# Patient Record
Sex: Male | Born: 1945 | Race: Black or African American | Hispanic: No | Marital: Married | Smoking: Never smoker
Health system: Southern US, Community
[De-identification: ages and names within clinical notes are randomized; demographics above are authoritative.]

## PROBLEM LIST (undated history)

## (undated) DIAGNOSIS — I10 Essential (primary) hypertension: Secondary | ICD-10-CM

## (undated) DIAGNOSIS — R569 Unspecified convulsions: Secondary | ICD-10-CM

---

## 2006-10-10 ENCOUNTER — Emergency Department (HOSPITAL_COMMUNITY): Admission: EM | Admit: 2006-10-10 | Discharge: 2006-10-10 | Payer: Self-pay | Admitting: Emergency Medicine

## 2006-10-20 ENCOUNTER — Emergency Department (HOSPITAL_COMMUNITY): Admission: EM | Admit: 2006-10-20 | Discharge: 2006-10-20 | Payer: Self-pay | Admitting: Emergency Medicine

## 2007-03-13 ENCOUNTER — Emergency Department (HOSPITAL_COMMUNITY): Admission: EM | Admit: 2007-03-13 | Discharge: 2007-03-13 | Payer: Self-pay | Admitting: Emergency Medicine

## 2007-05-18 ENCOUNTER — Emergency Department (HOSPITAL_COMMUNITY): Admission: EM | Admit: 2007-05-18 | Discharge: 2007-05-19 | Payer: Self-pay | Admitting: Emergency Medicine

## 2009-04-24 IMAGING — CT CT L SPINE W/O CM
1 of 3 series · 12 of 33 positions shown, 15 images · IV contrast (agent unspecified)
Comparison: None.

CLINICAL DATA: 61 year-old-male with low back and right leg pain. Had epidural steroid injection [REDACTED] of this week. 
 LUMBAR SPINE CT WITHOUT CONTRAST:
TECHNIQUE: Multidetector CT imaging of the lumbar spine was performed.  Multiplanar CT image reconstructions were also generated.

[Series 3: l-spine 3.0 b30s · axial · 0.29mm/px · z∈[-482,-304]mm · 12 of 71 slices shown, 15 images]
[im 6/71  soft-tissue]
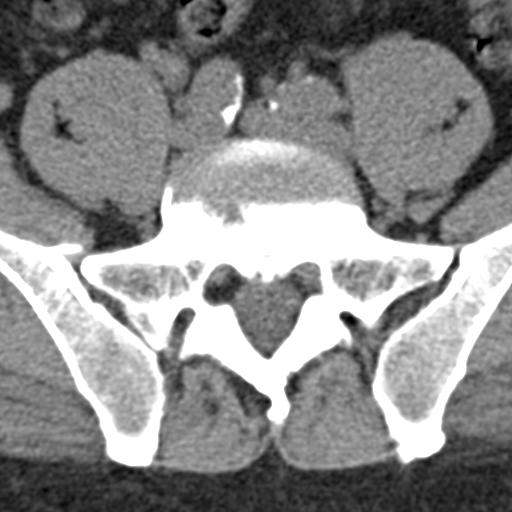
[im 6/71  bone]
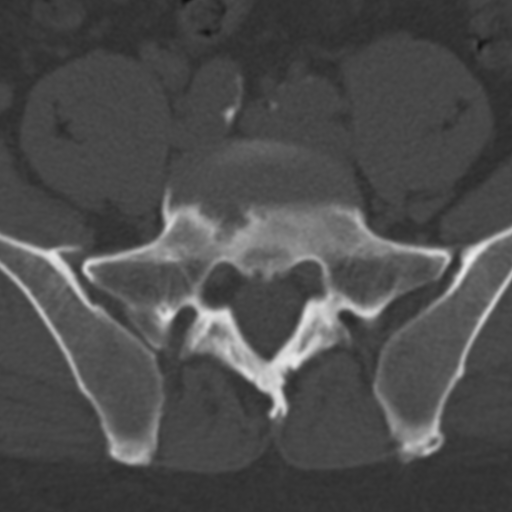
[im 11/71  bone]
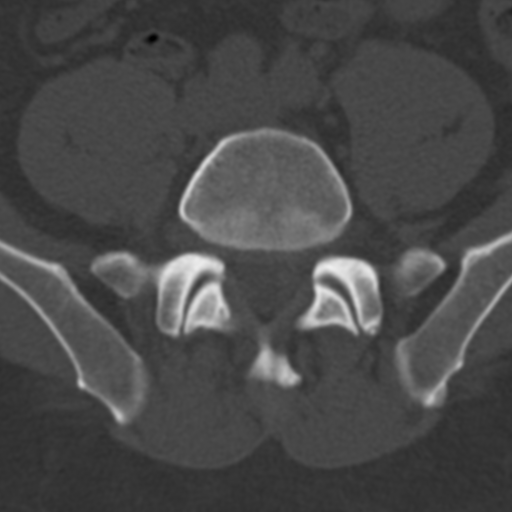
[im 17/71  bone]
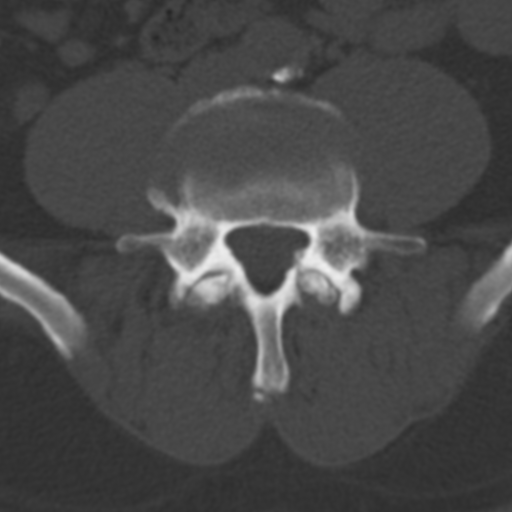
[im 22/71  bone]
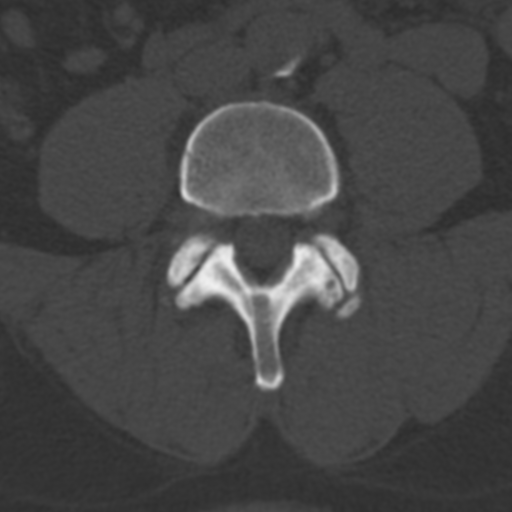
[im 27/71  soft-tissue]
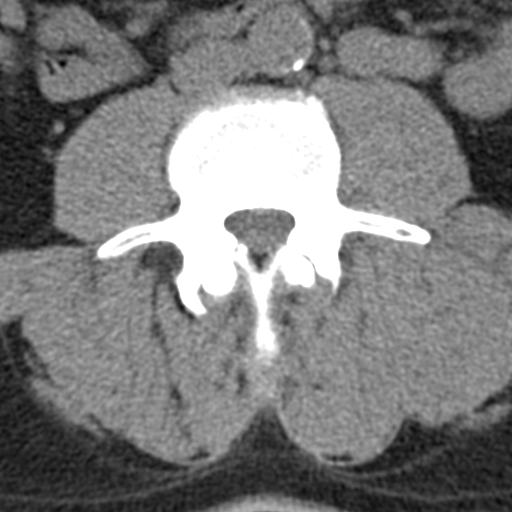
[im 27/71  bone]
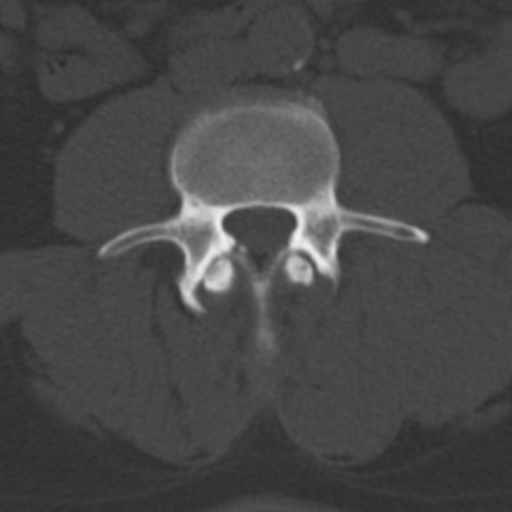
[im 33/71  bone]
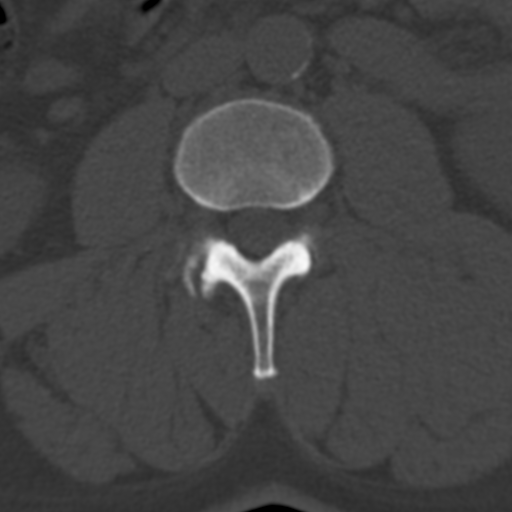
[im 38/71  bone]
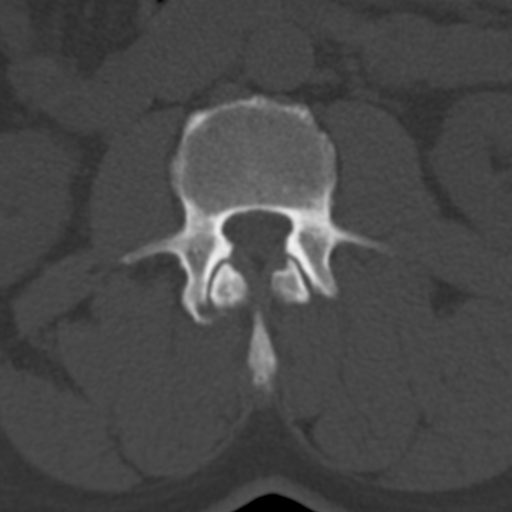
[im 44/71  bone]
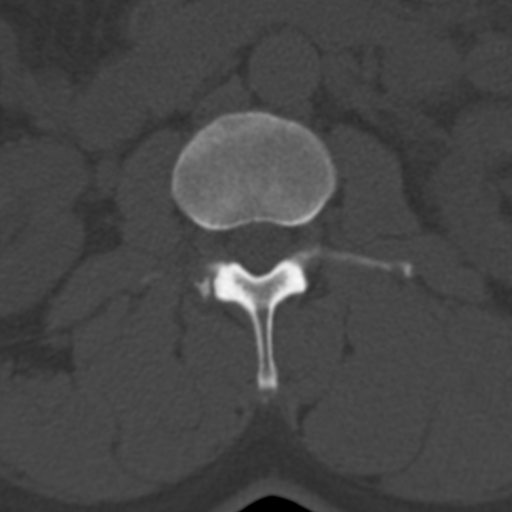
[im 49/71  soft-tissue]
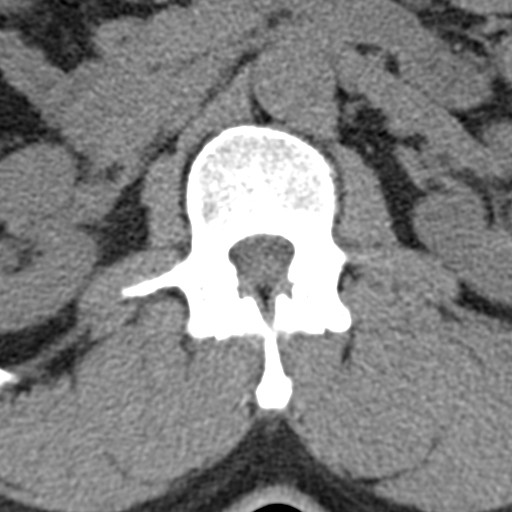
[im 49/71  bone]
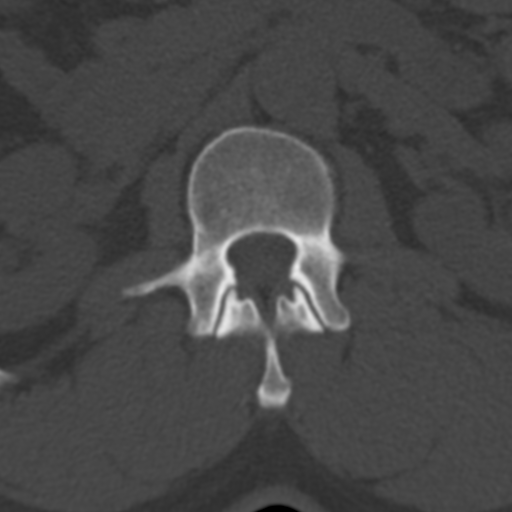
[im 54/71  bone]
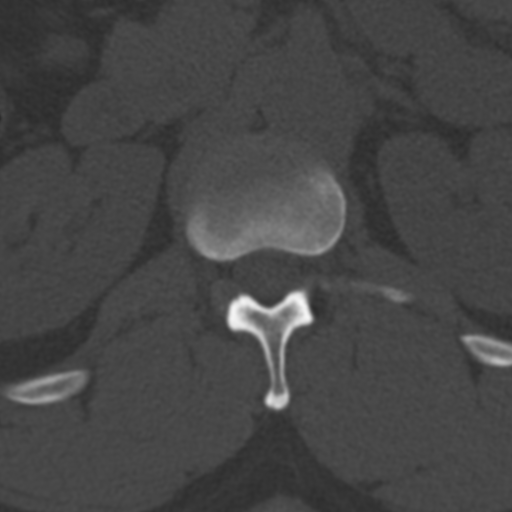
[im 60/71  bone]
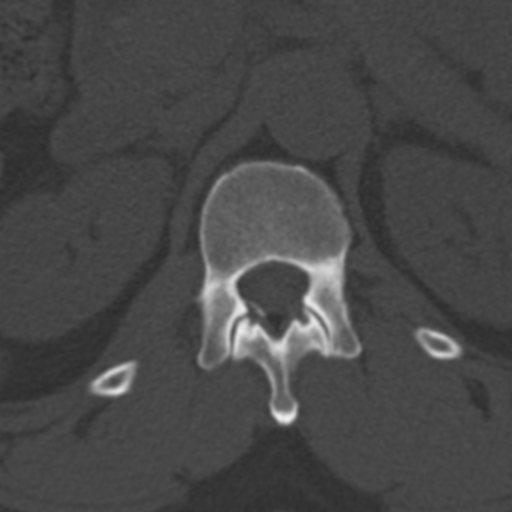
[im 65/71  bone]
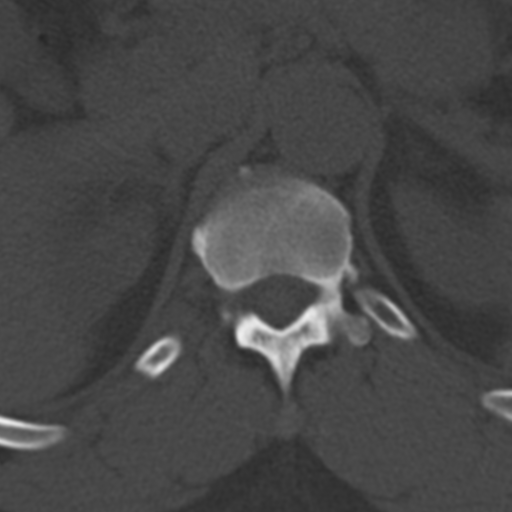

[12 of 33 positions shown; findings below may reference images not displayed]

FINDINGS: The sagittal reformatted images demonstrate normal alignment of the lumbar vertebral bodies.   No fractures are seen.  Degenerative spurring changes at L4-5 with mild disc space narrowing.  There is also mild degenerative disc disease at L5-S1.   Prominent Schmorl?s node in S1.
 There is severe facet disease throughout the lumbar spine with hypertrophy, spurring and sclerosis.  No pars defects are seen.  Mild diffuse annular bulge at L2-3 in combination with facet disease.  There is mild spinal lateral recess stenosis. 
 L3-4 demonstrates similar findings with moderate spinal lateral recess stenosis and mild foraminal stenosis.  
 L4-5:  Degenerative disc disease with a broad-based bulging annulus and osteophytic spurring.  Moderate spinal, lateral recess and foraminal stenosis. 
 L5-S1:  Shallow central disc protrusion. Mild spinal lateral recess stenosis.  No significant foraminal stenosis.
IMPRESSION: 1.  Severe multilevel facet disease contributing to spinal, lateral recess and foraminal stenosis from L2-3 down through L5-S1.  
 2.  No acute bony findings.  
 3.   Scattered aortic calcifications but no aneurysm.

## 2009-11-21 IMAGING — CT CT HEAD W/O CM
1 of 2 series · 16 of 30 positions shown, 20 images · non-contrast
Comparison: None

CLINICAL DATA: Seizures

CT HEAD WITHOUT CONTRAST
TECHNIQUE: Contiguous axial images were obtained from the base of
the skull through the vertex without contrast.

[Series 3: head trauma 2.4 h60s · axial · 0.43mm/px · z∈[-145,+15]mm · 16 of 72 slices shown, 20 images]
[im 4/72  brain]
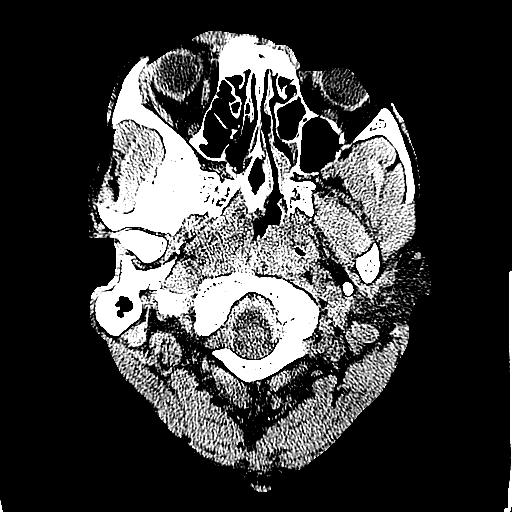
[im 4/72  bone]
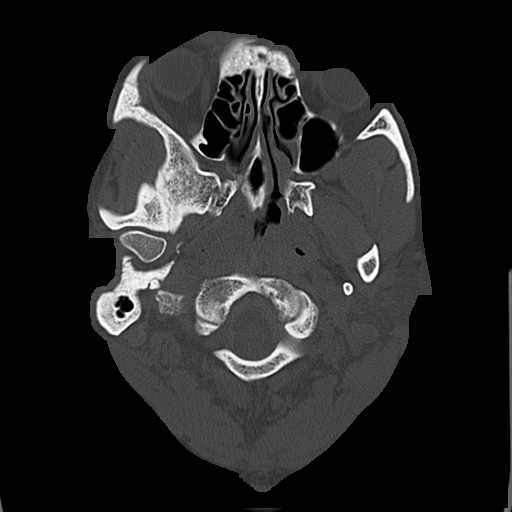
[im 8/72  brain]
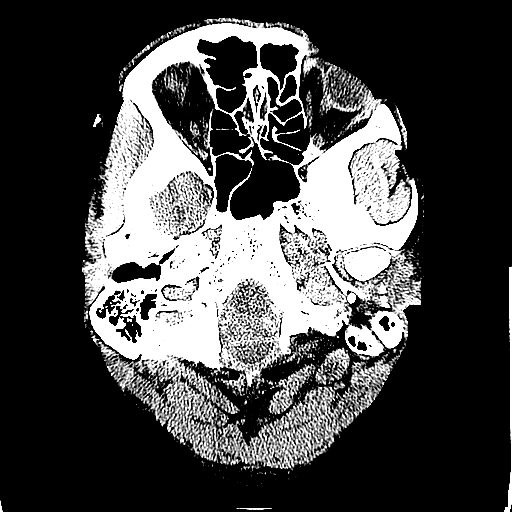
[im 12/72  brain]
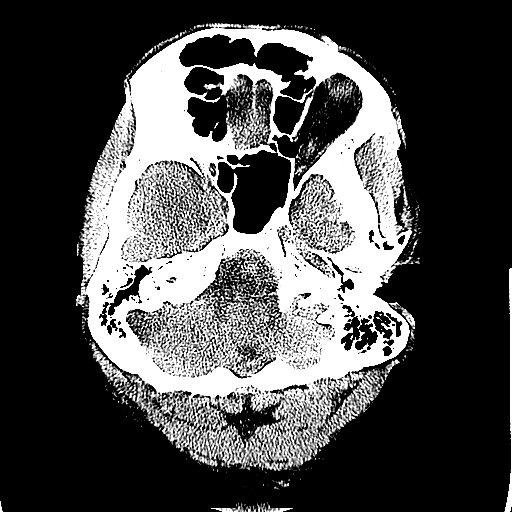
[im 15/72  brain]
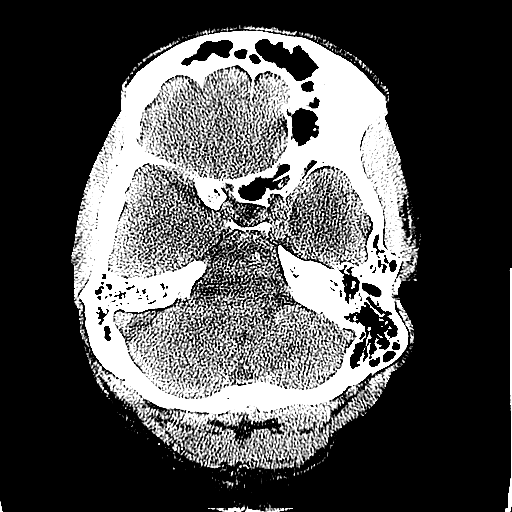
[im 19/72  brain]
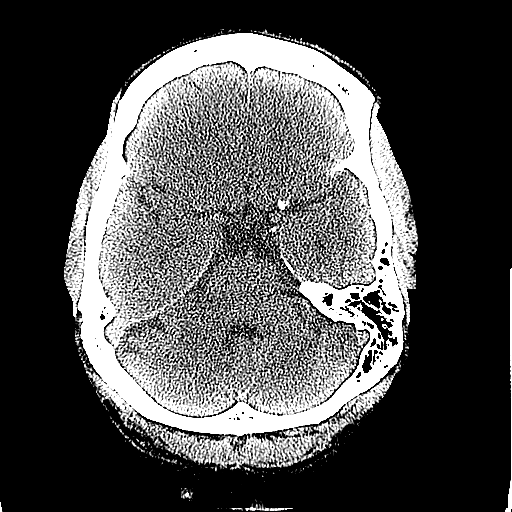
[im 19/72  bone]
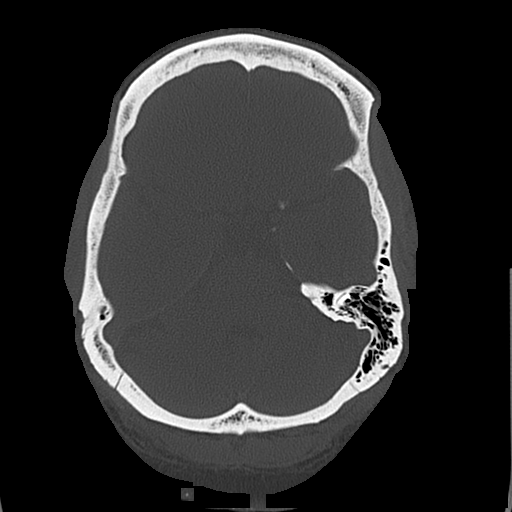
[im 23/72  brain]
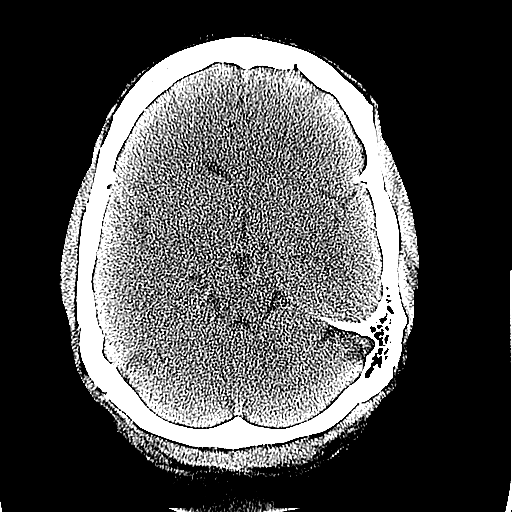
[im 27/72  brain]
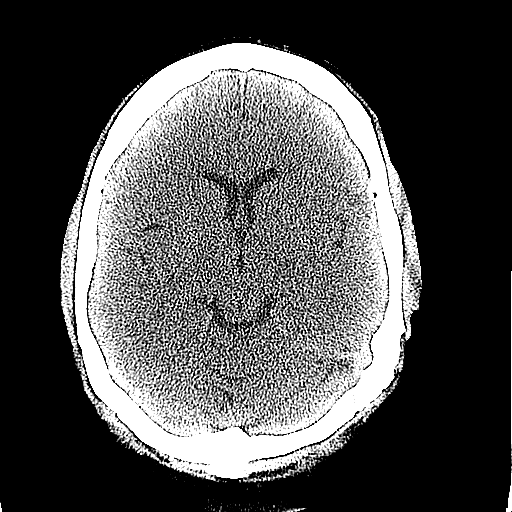
[im 30/72  brain]
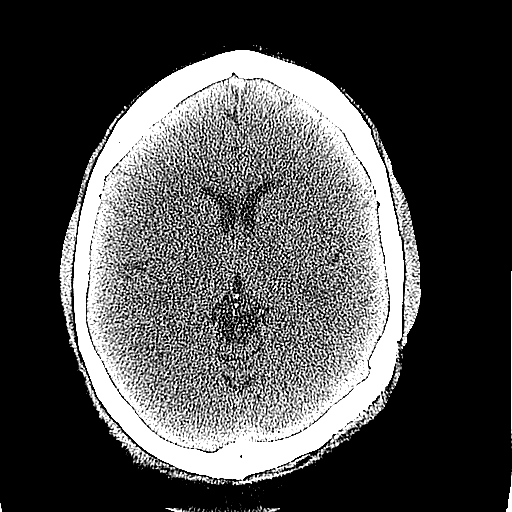
[im 38/72  brain]
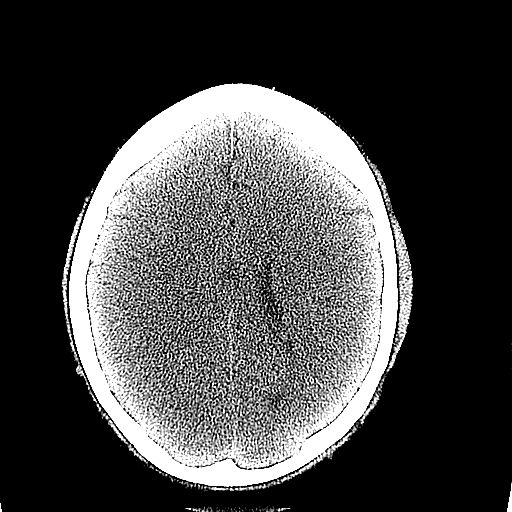
[im 38/72  bone]
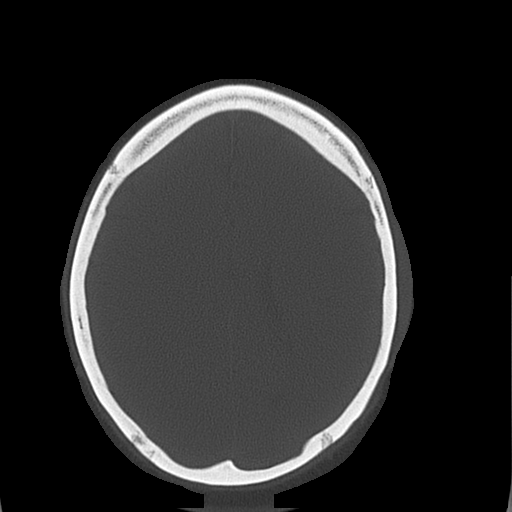
[im 42/72  brain]
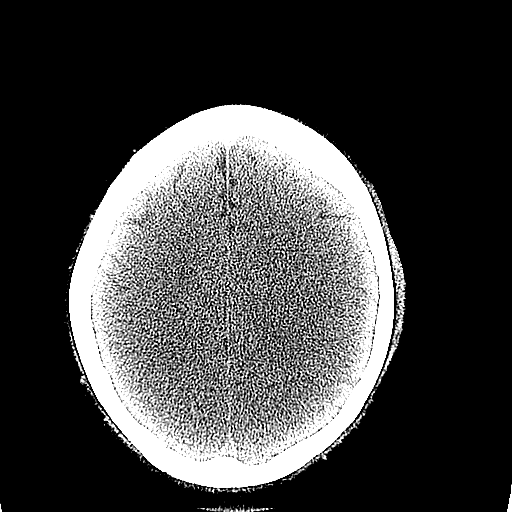
[im 45/72  brain]
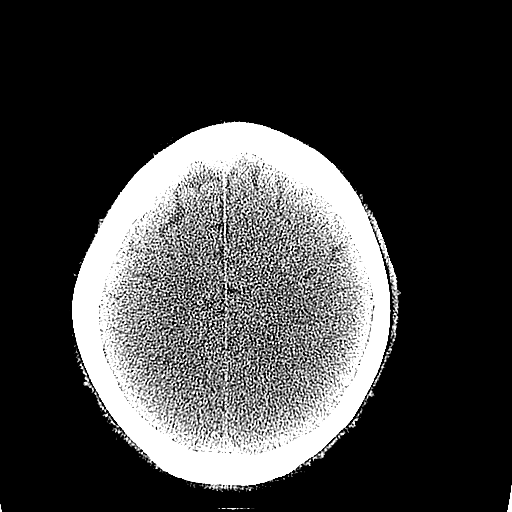
[im 49/72  brain]
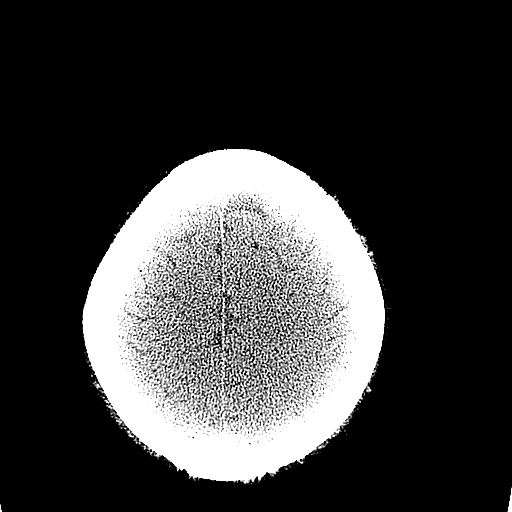
[im 57/72  brain]
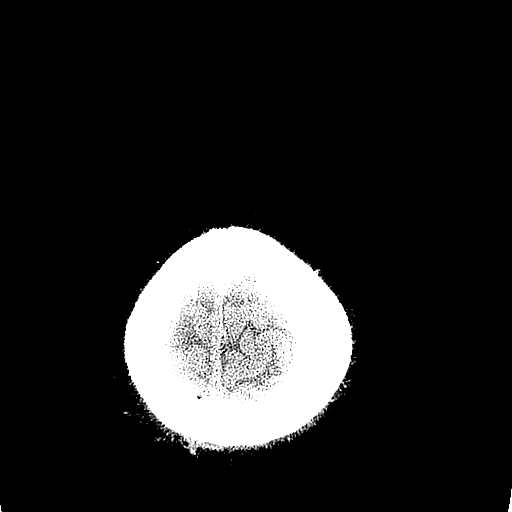
[im 57/72  bone]
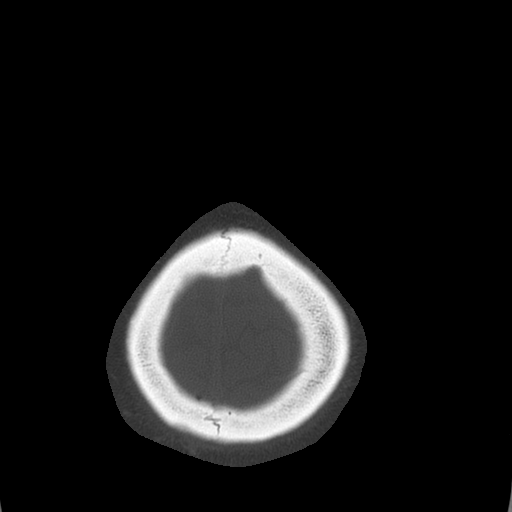
[im 60/72  brain]
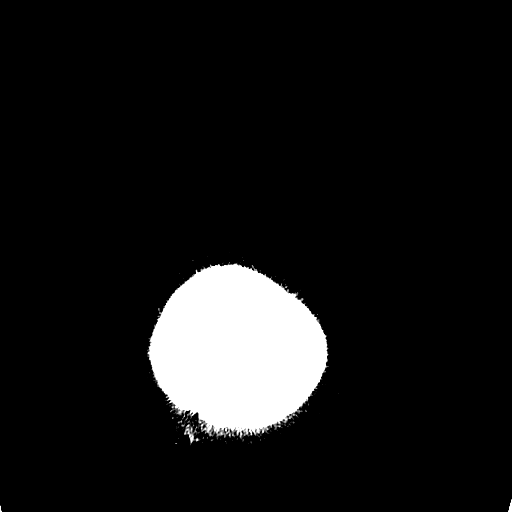
[im 64/72  brain]
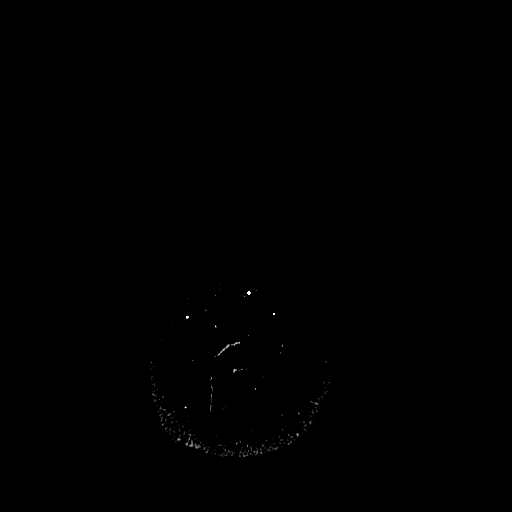
[im 68/72  brain]
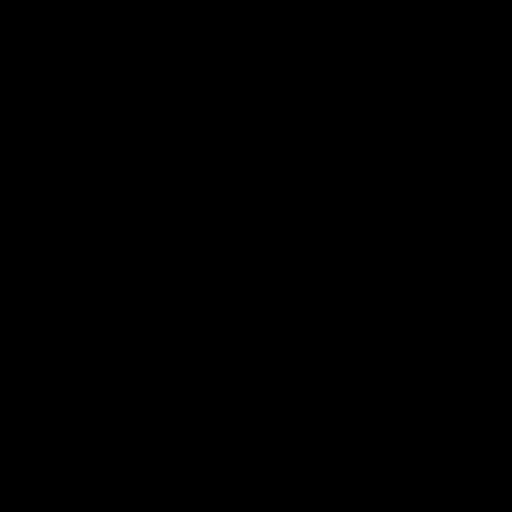

[16 of 30 positions shown; findings below may reference images not displayed]

FINDINGS: Normal ventricular morphology.
No midline shift or mass effect.
Normal appearance of brain parenchyma.
No intracranial hemorrhage, mass lesion, or acute infarct.
Visualized paranasal sinuses and mastoid air cells clear.
Bones unremarkable.
IMPRESSION: No acute intracranial abnormalities.

## 2010-03-15 ENCOUNTER — Encounter: Payer: Self-pay | Admitting: Family Medicine

## 2010-03-22 ENCOUNTER — Encounter (INDEPENDENT_AMBULATORY_CARE_PROVIDER_SITE_OTHER): Payer: Self-pay | Admitting: *Deleted

## 2010-03-22 LAB — CONVERTED CEMR LAB
ALT: 65 units/L — ABNORMAL HIGH (ref 0–53)
AST: 46 units/L — ABNORMAL HIGH (ref 0–37)
Albumin: 4.1 g/dL (ref 3.5–5.2)
CO2: 23 meq/L (ref 19–32)
Calcium: 9 mg/dL (ref 8.4–10.5)
Chloride: 107 meq/L (ref 96–112)
Cholesterol: 177 mg/dL (ref 0–200)
PSA: 1.77 ng/mL (ref <=4.00)
Potassium: 4.2 meq/L (ref 3.5–5.3)
Sodium: 139 meq/L (ref 135–145)
TSH: 1.4 microintl units/mL (ref 0.350–4.500)
Total Protein: 7.2 g/dL (ref 6.0–8.3)

## 2010-11-15 LAB — COMPREHENSIVE METABOLIC PANEL
ALT: 52
Albumin: 3.3 — ABNORMAL LOW
Alkaline Phosphatase: 57
BUN: 13
Chloride: 106
Glucose, Bld: 102 — ABNORMAL HIGH
Potassium: 3.5
Sodium: 135
Total Bilirubin: 0.7
Total Protein: 6.7

## 2010-11-15 LAB — CBC
HCT: 38.7 — ABNORMAL LOW
Hemoglobin: 13.2
WBC: 5.4

## 2010-11-15 LAB — DIFFERENTIAL
Basophils Absolute: 0
Basophils Relative: 1
Eosinophils Absolute: 0.1
Monocytes Absolute: 0.5
Monocytes Relative: 9
Neutro Abs: 3.5
Neutrophils Relative %: 65

## 2011-04-24 ENCOUNTER — Emergency Department (HOSPITAL_COMMUNITY)
Admission: EM | Admit: 2011-04-24 | Discharge: 2011-04-24 | Disposition: A | Discharge status: Home / Self Care | Payer: Self-pay | Attending: Emergency Medicine | Admitting: Emergency Medicine

## 2011-04-24 ENCOUNTER — Encounter (HOSPITAL_COMMUNITY): Payer: Self-pay | Admitting: *Deleted

## 2011-04-24 ENCOUNTER — Other Ambulatory Visit: Payer: Self-pay

## 2011-04-24 DIAGNOSIS — R569 Unspecified convulsions: Secondary | ICD-10-CM

## 2011-04-24 DIAGNOSIS — G40909 Epilepsy, unspecified, not intractable, without status epilepticus: Secondary | ICD-10-CM | POA: Insufficient documentation

## 2011-04-24 DIAGNOSIS — I10 Essential (primary) hypertension: Secondary | ICD-10-CM | POA: Insufficient documentation

## 2011-04-24 DIAGNOSIS — Z79899 Other long term (current) drug therapy: Secondary | ICD-10-CM | POA: Insufficient documentation

## 2011-04-24 DIAGNOSIS — R404 Transient alteration of awareness: Secondary | ICD-10-CM | POA: Insufficient documentation

## 2011-04-24 DIAGNOSIS — R4182 Altered mental status, unspecified: Secondary | ICD-10-CM | POA: Insufficient documentation

## 2011-04-24 HISTORY — DX: Unspecified convulsions: R56.9

## 2011-04-24 HISTORY — DX: Essential (primary) hypertension: I10

## 2011-04-24 LAB — URINALYSIS, ROUTINE W REFLEX MICROSCOPIC
Bilirubin Urine: NEGATIVE
Leukocytes, UA: NEGATIVE
Nitrite: NEGATIVE
Specific Gravity, Urine: 1.022 (ref 1.005–1.030)
Urobilinogen, UA: 0.2 mg/dL (ref 0.0–1.0)
pH: 5.5 (ref 5.0–8.0)

## 2011-04-24 LAB — BASIC METABOLIC PANEL
BUN: 15 mg/dL (ref 6–23)
Chloride: 99 mEq/L (ref 96–112)
GFR calc Af Amer: 85 mL/min — ABNORMAL LOW (ref >90)
GFR calc non Af Amer: 73 mL/min — ABNORMAL LOW (ref >90)
Potassium: 3.2 mEq/L — ABNORMAL LOW (ref 3.5–5.1)
Sodium: 137 mEq/L (ref 135–145)

## 2011-04-24 LAB — DIFFERENTIAL
Basophils Absolute: 0 10*3/uL (ref 0.0–0.1)
Basophils Relative: 0 % (ref 0–1)
Eosinophils Absolute: 0 10*3/uL (ref 0.0–0.7)
Neutro Abs: 2.6 10*3/uL (ref 1.7–7.7)
Neutrophils Relative %: 55 % (ref 43–77)

## 2011-04-24 LAB — CBC
Hemoglobin: 15.5 g/dL (ref 13.0–17.0)
MCH: 31.2 pg (ref 26.0–34.0)
MCHC: 36.8 g/dL — ABNORMAL HIGH (ref 30.0–36.0)
Platelets: 298 10*3/uL (ref 150–400)

## 2011-04-24 LAB — URINE MICROSCOPIC-ADD ON

## 2011-04-24 MED ORDER — SODIUM CHLORIDE 0.9 % IV SOLN
500.0000 mg | INTRAVENOUS | Status: AC
  Administered 2011-04-24: 500 mg via INTRAVENOUS
  Filled 2011-04-24: qty 5

## 2011-04-24 MED ORDER — SODIUM CHLORIDE 0.9 % IV BOLUS (SEPSIS)
1000.0000 mL | Freq: Once | INTRAVENOUS | Status: AC
  Administered 2011-04-24: 1000 mL via INTRAVENOUS

## 2011-04-24 MED ORDER — POTASSIUM CHLORIDE CRYS ER 20 MEQ PO TBCR
40.0000 meq | EXTENDED_RELEASE_TABLET | Freq: Once | ORAL | Status: AC
  Administered 2011-04-24: 40 meq via ORAL
  Filled 2011-04-24: qty 2

## 2011-04-24 NOTE — ED Provider Notes (Signed)
History     CSN: [id number]  Arrival date & time 04/24/11  1535   First MD Initiated Contact with Patient 04/24/11 1538      Chief Complaint  Patient presents with  . Loss of Consciousness    (Consider location/radiation/quality/duration/timing/severity/associated sxs/prior treatment) Patient is a 66 y.o. male presenting with altered mental status. The history is provided by the patient and the EMS personnel.  Altered Mental Status This is a new problem. The current episode started today. The problem occurs rarely. The problem has been resolved. Pertinent negatives include no abdominal pain, chest pain, congestion, coughing, diaphoresis, fatigue, fever, headaches, nausea, vomiting or weakness. The symptoms are aggravated by nothing. He has tried nothing for the symptoms.    Past Medical History  Diagnosis Date  . Seizures   . Hypertension     History reviewed. No pertinent past surgical history.  History reviewed. No pertinent family history.  History  Substance Use Topics  . Smoking status: Never Smoker   . Smokeless tobacco: Not on file  . Alcohol Use: No      Review of Systems  Constitutional: Negative for fever, diaphoresis and fatigue.  HENT: Negative for congestion.   Respiratory: Negative for cough.   Cardiovascular: Negative for chest pain.  Gastrointestinal: Negative for nausea, vomiting and abdominal pain.  Neurological: Negative for weakness and headaches.  Psychiatric/Behavioral: Positive for altered mental status.  All other systems reviewed and are negative.    Allergies  Review of patient's allergies indicates not on file.  Home Medications  No current outpatient prescriptions on file.  BP 120/64  Pulse 90  Temp(Src) 98.1 F (36.7 C) (Oral)  Resp 16  SpO2 98%  Physical Exam  Nursing note and vitals reviewed. Constitutional: He is oriented to person, place, and time. He appears well-developed and well-nourished. No distress.  HENT:    Head: Normocephalic and atraumatic.  Right Ear: External ear normal.  Left Ear: External ear normal.  Mouth/Throat: Oropharynx is clear and moist.  Eyes: Pupils are equal, round, and reactive to light.  Neck: Normal range of motion. Neck supple.       Cervical Collar in place.  Cardiovascular: Normal rate, regular rhythm, normal heart sounds and intact distal pulses.  Exam reveals no gallop and no friction rub.   No murmur heard. Pulmonary/Chest: Effort normal and breath sounds normal. No respiratory distress. He has no wheezes. He has no rales.  Abdominal: Soft. There is no tenderness. There is no rebound and no guarding.  Musculoskeletal: Normal range of motion. He exhibits no edema and no tenderness.  Lymphadenopathy:    He has no cervical adenopathy.  Neurological: He is alert and oriented to person, place, and time. He has normal strength. No cranial nerve deficit or sensory deficit. GCS eye subscore is 4. GCS verbal subscore is 5. GCS motor subscore is 6.       5/5 strength in all Shoney's. Normal finger to nose test.  Skin: Skin is warm and dry. No rash noted. No erythema.  Psychiatric: He has a normal mood and affect. His behavior is normal.    ED Course  Procedures (including critical care time)  Results for orders placed during the hospital encounter of 04/24/11  CBC      Component Value Range   WBC 4.7  4.0 - 10.5 (K/uL)   RBC 4.97  4.22 - 5.81 (MIL/uL)   Hemoglobin 15.5  13.0 - 17.0 (g/dL)   HCT 78.2  95.6 - 21.3 (%)  MCV 84.7  78.0 - 100.0 (fL)   MCH 31.2  26.0 - 34.0 (pg)   MCHC 36.8 (*) 30.0 - 36.0 (g/dL)   RDW 57.3  22.0 - 25.4 (%)   Platelets 298  150 - 400 (K/uL)  DIFFERENTIAL      Component Value Range   Neutrophils Relative 55  43 - 77 (%)   Neutro Abs 2.6  1.7 - 7.7 (K/uL)   Lymphocytes Relative 40  12 - 46 (%)   Lymphs Abs 1.9  0.7 - 4.0 (K/uL)   Monocytes Relative 5  3 - 12 (%)   Monocytes Absolute 0.2  0.1 - 1.0 (K/uL)   Eosinophils Relative 0  0  - 5 (%)   Eosinophils Absolute 0.0  0.0 - 0.7 (K/uL)   Basophils Relative 0  0 - 1 (%)   Basophils Absolute 0.0  0.0 - 0.1 (K/uL)  BASIC METABOLIC PANEL      Component Value Range   Sodium 137  135 - 145 (mEq/L)   Potassium 3.2 (*) 3.5 - 5.1 (mEq/L)   Chloride 99  96 - 112 (mEq/L)   CO2 18 (*) 19 - 32 (mEq/L)   Glucose, Bld 148 (*) 70 - 99 (mg/dL)   BUN 15  6 - 23 (mg/dL)   Creatinine, Ser 2.70  0.50 - 1.35 (mg/dL)   Calcium 62.3 (*) 8.4 - 10.5 (mg/dL)   GFR calc non Af Amer 73 (*) >90 (mL/min)   GFR calc Af Amer 85 (*) >90 (mL/min)  URINALYSIS, ROUTINE W REFLEX MICROSCOPIC      Component Value Range   Color, Urine YELLOW  YELLOW    APPearance CLEAR  CLEAR    Specific Gravity, Urine 1.022  1.005 - 1.030    pH 5.5  5.0 - 8.0    Glucose, UA NEGATIVE  NEGATIVE (mg/dL)   Hgb urine dipstick NEGATIVE  NEGATIVE    Bilirubin Urine NEGATIVE  NEGATIVE    Ketones, ur NEGATIVE  NEGATIVE (mg/dL)   Protein, ur 30 (*) NEGATIVE (mg/dL)   Urobilinogen, UA 0.2  0.0 - 1.0 (mg/dL)   Nitrite NEGATIVE  NEGATIVE    Leukocytes, UA NEGATIVE  NEGATIVE   GLUCOSE, CAPILLARY      Component Value Range   Glucose-Capillary 130 (*) 70 - 99 (mg/dL)  URINE MICROSCOPIC-ADD ON      Component Value Range   Squamous Epithelial / LPF RARE  RARE    WBC, UA 0-2  <3 (WBC/hpf)   RBC / HPF 0-2  <3 (RBC/hpf)   Urine-Other MUCOUS PRESENT         Date: 04/24/2011  Rate: 88  Rhythm: normal sinus rhythm  Axis: normal  Intervals: normal  ST/T Wave abnormalities: nonspecific T wave changes  Conduction Disutrbances:none  Narrative Interpretation:   Old EKG Reviewed: unchanged   1. Seizure       MDM  29:22 PM 66 year old male with history of hypertension and seizure disorder on Keppra presenting after he was found down in his garage by his wife. The patient is amnestic to the event. The wife has not yet arrived for further questioning. Per EMS he was confused in route. The patient does recall waking up  confused. He denies missing any doses of his Keppra. He denies any recent fever, cough, congestion, abdominal pain, nausea, vomiting or diarrhea. He denies any recent stressors and states it has been getting plenty of sleep. It is unclear if this is syncope versus seizure. He has no headache  and no external signs of head trauma. His neurologic exam is nonfocal. At this point has not felt that a head CT is indicated. We'll check an EKG, CBC, BMP and urinalysis and question further the wife regarding the events.and  5:09 PM in further discussion with the wife she does confirm a seizure activity that lasted for roughly 15 minutes. Upon further questioning it appears that he may have missed some of his doses of keppra. He is now returned to baseline. He continues to deny headache, nausea or vomiting. Patient given IV fluids and IV dose of Keppra. Discussed with wife and he will followup with his primary neurologist for further evaluation. This was discussed with the patient and he was given strong seizure precautions. The patient voiced understanding and was discharged home in stable condition.      Sheran Luz, MD 04/25/11 0038     I have personally seen and examined the patient.  I have discussed the plan of care with the resident.  I have reviewed the documentation on PMH/FH/Soc. History.  I have reviewed the documentation of the resident and agree.  I have reviewed and agree with the ECG interpretation(s) documented by the resident.  Pt back to baseline, ambulatory, well appearing, stable for d/c  Joya Gaskins, MD 04/25/11 2008

## 2011-04-24 NOTE — ED Notes (Signed)
Note pt does take keppra for seizures, has not missed any doses.

## 2011-04-24 NOTE — ED Notes (Signed)
Pt's CBG was 130 when I checked it. 3:56pm JG.

## 2011-04-24 NOTE — ED Notes (Signed)
C-collar removed by EDP.   

## 2011-04-24 NOTE — ED Notes (Addendum)
Pt was found on garage floor unresponsive. Pt has hx of seizures. Pt was alert to voice only on EMS arrival but has become more responsive en route. 20g(L)hand established. Pt placed on lsb and ccollar. Does not remember event. Systolic 90 palpated.

## 2011-04-24 NOTE — ED Provider Notes (Signed)
Pt seen with resident, here for altered mental status, now improved Wife arrived and confirmed she witnessed seizure.  It terminated spontaneously He is now back to baseline, confirms missing keppra dose  Joya Gaskins, MD 04/24/11 [phone]

## 2011-04-24 NOTE — ED Notes (Signed)
Pt states he did miss his dose of Keppra this morning.

## 2012-11-03 ENCOUNTER — Encounter (HOSPITAL_COMMUNITY): Payer: Self-pay | Admitting: Emergency Medicine

## 2012-11-03 ENCOUNTER — Emergency Department (INDEPENDENT_AMBULATORY_CARE_PROVIDER_SITE_OTHER)
Admission: EM | Admit: 2012-11-03 | Discharge: 2012-11-03 | Disposition: A | Discharge status: Home / Self Care | Payer: Medicare Other | Source: Home / Self Care | Attending: Family Medicine | Admitting: Family Medicine

## 2012-11-03 DIAGNOSIS — B354 Tinea corporis: Secondary | ICD-10-CM

## 2012-11-03 NOTE — ED Notes (Signed)
Pt c/o rash on groin area onset 3/4 days Reports new laundry detergent  Has tried OTC medicine that may have made it worse He is alert w/no signs of acuate distress.

## 2012-11-03 NOTE — ED Provider Notes (Signed)
James Beasley is a 67 y.o. male who presents to Urgent Care today for groin rash present for 3-4 days. Patient notes that he is use a different laundry detergent which may be worsening his symptoms. He denies any injury. He's tried some over-the-counter creams intermittently which have not helped. He additionally notes some scrotal swelling starting today. He denies any abdominal pain fevers chills nausea vomiting or diarrhea. He feels well otherwise.    Past Medical History  Diagnosis Date  . Seizures   . Hypertension    History  Substance Use Topics  . Smoking status: Never Smoker   . Smokeless tobacco: Not on file  . Alcohol Use: No   ROS as above Medications reviewed. No current facility-administered medications for this encounter.   Current Outpatient Prescriptions  Medication Sig Dispense Refill  . amLODipine (NORVASC) 10 MG tablet Take 10 mg by mouth daily.      . hydrochlorothiazide (HYDRODIURIL) 25 MG tablet Take 25 mg by mouth daily.      Marland Kitchen levETIRAcetam (KEPPRA) 500 MG tablet Take 500 mg by mouth every 12 (twelve) hours.      . benazepril (LOTENSIN) 20 MG tablet Take 20 mg by mouth daily.        Exam:  BP 156/78  Pulse 63  Temp(Src) 98.1 F (36.7 C) (Oral)  Resp 16  SpO2 98% Gen: Well NAD SKIN: Large hyperpigmented rash in the groin. Some scale present. Green blue fluorescence under [employer].  Scrotum: Mildly swollen. Testes present bilaterally. Hydrocele palpated.    No results found for this or any previous visit (from the past 24 hour(s)). No results found.  Assessment and Plan: 67 y.o. male with  Tinea corporis: Plan to use Lamisil over-the-counter cream for 2 weeks Hydrocele: Most likely cause of mass. Follow up with primary care provider. May refer to urology Discussed warning signs or symptoms. Please see discharge instructions. Patient expresses understanding.      Rodolph Bong, MD 11/03/12 [phone]

## 2012-11-04 ENCOUNTER — Encounter (HOSPITAL_COMMUNITY): Payer: Self-pay | Admitting: Emergency Medicine

## 2012-11-04 ENCOUNTER — Emergency Department (HOSPITAL_COMMUNITY)
Admission: EM | Admit: 2012-11-04 | Discharge: 2012-11-04 | Disposition: A | Discharge status: Home / Self Care | Payer: Medicare Other | Attending: Emergency Medicine | Admitting: Emergency Medicine

## 2012-11-04 DIAGNOSIS — N4889 Other specified disorders of penis: Secondary | ICD-10-CM

## 2012-11-04 DIAGNOSIS — I10 Essential (primary) hypertension: Secondary | ICD-10-CM | POA: Insufficient documentation

## 2012-11-04 DIAGNOSIS — Z791 Long term (current) use of non-steroidal anti-inflammatories (NSAID): Secondary | ICD-10-CM | POA: Insufficient documentation

## 2012-11-04 DIAGNOSIS — T7840XA Allergy, unspecified, initial encounter: Secondary | ICD-10-CM

## 2012-11-04 DIAGNOSIS — N509 Disorder of male genital organs, unspecified: Secondary | ICD-10-CM | POA: Insufficient documentation

## 2012-11-04 DIAGNOSIS — G40909 Epilepsy, unspecified, not intractable, without status epilepticus: Secondary | ICD-10-CM | POA: Insufficient documentation

## 2012-11-04 DIAGNOSIS — B356 Tinea cruris: Secondary | ICD-10-CM | POA: Insufficient documentation

## 2012-11-04 DIAGNOSIS — Z79899 Other long term (current) drug therapy: Secondary | ICD-10-CM | POA: Insufficient documentation

## 2012-11-04 MED ORDER — PREDNISONE 10 MG PO TABS: 20.0000 mg | ORAL_TABLET | Freq: Every day | ORAL | Status: DC

## 2012-11-04 MED ORDER — DIPHENHYDRAMINE HCL 25 MG PO CAPS
50.0000 mg | ORAL_CAPSULE | Freq: Once | ORAL | Status: AC
  Administered 2012-11-04: 50 mg via ORAL
  Filled 2012-11-04: qty 2

## 2012-11-04 MED ORDER — PREDNISONE 20 MG PO TABS
60.0000 mg | ORAL_TABLET | Freq: Once | ORAL | Status: AC
  Administered 2012-11-04: 60 mg via ORAL
  Filled 2012-11-04: qty 3

## 2012-11-04 MED ORDER — DIPHENHYDRAMINE HCL 25 MG PO TABS: 25.0000 mg | ORAL_TABLET | ORAL | Status: AC | PRN

## 2012-11-04 NOTE — ED Notes (Signed)
Pt. reports penile and bilateral testicle swelling with pain / groin itching for several days , denies injury or discharge .

## 2012-11-04 NOTE — ED Provider Notes (Signed)
Medical screening examination/treatment/procedure(s) were performed by non-physician practitioner and as supervising physician I was immediately available for consultation/collaboration.   Darlys Gales, MD 11/04/12 1040

## 2012-11-04 NOTE — ED Provider Notes (Signed)
CSN: [id number]     Arrival date & time 11/04/12  0144 History   First MD Initiated Contact with Patient 11/04/12 [phone]     Chief Complaint  Patient presents with  . Penis Pain  . Testicle Pain   HPI  History provided by the patient and recent medical chart. The patient is a 67 year old male with history of hypertension and seizure disorder presenting with complaints of worsening swelling and itching around his penis and scrotum. Symptoms have been present with significant itching for the past week or more. He began having swelling 2 days ago. He was seen in urgent care yesterday for symptoms and was given a prescription for Lamisil cream with concerns for possible fungal infection. Patient did use this yesterday evening but states he has had continued swelling and itching. Prior to this he was using home remedies and also topical hydrocortisone cream. He has never had similar symptoms previously. He denies any difficulty urinating. No dysuria, hematuria or urinary frequencies. He denies any pain. No skin changes or rash. No blisters or ulcers.    Past Medical History  Diagnosis Date  . Seizures   . Hypertension    History reviewed. No pertinent past surgical history. No family history on file. History  Substance Use Topics  . Smoking status: Never Smoker   . Smokeless tobacco: Not on file  . Alcohol Use: No    Review of Systems  Constitutional: Negative for fever, chills and diaphoresis.  Genitourinary: Positive for penile swelling and scrotal swelling. Negative for dysuria, hematuria, flank pain, discharge, genital sores, penile pain and testicular pain.  All other systems reviewed and are negative.    Allergies  Review of patient's allergies indicates no known allergies.  Home Medications   Current Outpatient Rx  Name  Route  Sig  Dispense  Refill  . amLODipine (NORVASC) 10 MG tablet   Oral   Take 10 mg by mouth daily.         . benazepril (LOTENSIN) 20 MG tablet  Oral   Take 20 mg by mouth daily.         . hydrochlorothiazide (HYDRODIURIL) 25 MG tablet   Oral   Take 25 mg by mouth daily.         Marland Kitchen levETIRAcetam (KEPPRA) 500 MG tablet   Oral   Take 500 mg by mouth every 12 (twelve) hours.         . naproxen sodium (ANAPROX) 220 MG tablet   Oral   Take 220 mg by mouth 2 (two) times daily with a meal.          BP 133/81  Temp(Src) 97.8 F (36.6 C) (Oral)  Resp 16  SpO2 99% Physical Exam  Nursing note and vitals reviewed. Constitutional: He is oriented to person, place, and time. He appears well-developed and well-nourished. No distress.  HENT:  Head: Normocephalic.  Cardiovascular: Normal rate and regular rhythm.   Pulmonary/Chest: Effort normal and breath sounds normal. No respiratory distress. He has no wheezes. He has no rales.  Abdominal: Soft. There is no tenderness. There is no rebound and no guarding.  Genitourinary: Uncircumcised.  Diffuse swelling of the skin around the penis and shaft. Foreskin is retractable. No significant swelling at of the glans. Mild swelling into the scrotum diffusely. No tenderness or masses of the testicles. No tenderness of the epididymis. No lymphadenopathy. No penile discharge. Skin normal without erythema, rash or lesions  Neurological: He is alert and oriented to person, place, and  time.  Skin: Skin is warm. No erythema.  Psychiatric: He has a normal mood and affect. His behavior is normal.    ED Course  Procedures     MDM   1. Penile swelling   2. Tinea cruris   3. Allergic reaction, initial encounter       2:30AM patient seen and evaluated. He is well appearing in no acute distress. He was evaluated for similar symptoms yesterday afternoon at urgent care. He has used Lamisil cream as instructed without any change.    There is no significant rash or erythema of the genitals.  Patient has never had similar symptoms previously. He does take an ACE inhibitor and has been on this  for a long time.  It may be possible to consider angio edema of genitals as cause however this is unclear at this time.  I will plan to advise pt to stop taking for the next few days and follow up with his PCP on Monday.  In the meantime I will give low does prednisone to take for the next 5 days as well as benadryl.  Pt will continue Lamisil cream for possible fungal cause.    Pt discussed with Attending physician who agrees with plan.   Angus Seller, PA-C 11/04/12 [phone]

## 2013-08-16 ENCOUNTER — Emergency Department (HOSPITAL_COMMUNITY)
Admission: EM | Admit: 2013-08-16 | Discharge: 2013-08-16 | Disposition: A | Discharge status: Home / Self Care | Payer: Medicare Other | Attending: Emergency Medicine | Admitting: Emergency Medicine

## 2013-08-16 ENCOUNTER — Encounter (HOSPITAL_COMMUNITY): Payer: Self-pay | Admitting: Emergency Medicine

## 2013-08-16 DIAGNOSIS — I10 Essential (primary) hypertension: Secondary | ICD-10-CM | POA: Insufficient documentation

## 2013-08-16 DIAGNOSIS — Y9289 Other specified places as the place of occurrence of the external cause: Secondary | ICD-10-CM | POA: Insufficient documentation

## 2013-08-16 DIAGNOSIS — Y939 Activity, unspecified: Secondary | ICD-10-CM | POA: Insufficient documentation

## 2013-08-16 DIAGNOSIS — W57XXXA Bitten or stung by nonvenomous insect and other nonvenomous arthropods, initial encounter: Secondary | ICD-10-CM

## 2013-08-16 DIAGNOSIS — Z79899 Other long term (current) drug therapy: Secondary | ICD-10-CM | POA: Insufficient documentation

## 2013-08-16 DIAGNOSIS — IMO0002 Reserved for concepts with insufficient information to code with codable children: Secondary | ICD-10-CM | POA: Insufficient documentation

## 2013-08-16 DIAGNOSIS — G40909 Epilepsy, unspecified, not intractable, without status epilepticus: Secondary | ICD-10-CM | POA: Insufficient documentation

## 2013-08-16 DIAGNOSIS — Z792 Long term (current) use of antibiotics: Secondary | ICD-10-CM | POA: Insufficient documentation

## 2013-08-16 MED ORDER — CEPHALEXIN 500 MG PO CAPS: 500.0000 mg | ORAL_CAPSULE | Freq: Four times a day (QID) | ORAL | Status: AC

## 2013-08-16 MED ORDER — DIPHENHYDRAMINE HCL 25 MG PO TABS: 25.0000 mg | ORAL_TABLET | Freq: Four times a day (QID) | ORAL | Status: AC

## 2013-08-16 NOTE — ED Provider Notes (Signed)
Medical screening examination/treatment/procedure(s) were performed by non-physician practitioner and as supervising physician I was immediately available for consultation/collaboration.  Elliott L Wentz, MD 08/16/13 2354 

## 2013-08-16 NOTE — ED Provider Notes (Signed)
CSN: [id number]     Arrival date & time 08/16/13  2053 History   First MD Initiated Contact with Patient 08/16/13 2240     Chief Complaint  Patient presents with  . Insect Bite   HPI  History provided by the patient. Patient is a 68 year old male with history of seizure disorder and hypertension presenting with concerns for insect bite to the right hand. Patient states that he went to pick up his car which was parked in the woods earlier today around 1 PM. Sometime during that process noticed slight itching and swelling to the top of his right hand. Later through the afternoon and evening he began having worsening swelling to the top of the hand. He denies any pain to the area and does report that the itching has improved. She was concerned however because the swelling has been much more significant and feels very tight when he tries to make a fist. Denies any other associated symptoms.   Past Medical History  Diagnosis Date  . Seizures   . Hypertension    History reviewed. No pertinent past surgical history. No family history on file. History  Substance Use Topics  . Smoking status: Never Smoker   . Smokeless tobacco: Not on file  . Alcohol Use: No    Review of Systems  Constitutional: Negative for fever, chills and diaphoresis.  All other systems reviewed and are negative.     Allergies  Review of patient's allergies indicates no known allergies.  Home Medications   Prior to Admission medications   Medication Sig Start Date End Date Taking? Authorizing Provider  amLODipine (NORVASC) 10 MG tablet Take 10 mg by mouth daily.   Yes Historical Provider, MD  benazepril (LOTENSIN) 20 MG tablet Take 20 mg by mouth daily.   Yes Historical Provider, MD  diphenhydrAMINE (BENADRYL) 25 MG tablet Take 1 tablet (25 mg total) by mouth every 4 (four) hours as needed for itching. 11/04/12  Yes Peter S Dammen, PA-C  hydrochlorothiazide (HYDRODIURIL) 25 MG tablet Take 25 mg by mouth daily.    Yes Historical Provider, MD  levETIRAcetam (KEPPRA) 500 MG tablet Take 500 mg by mouth every 12 (twelve) hours.   Yes Historical Provider, MD  naproxen sodium (ANAPROX) 220 MG tablet Take 220 mg by mouth 2 (two) times daily with a meal.   Yes Historical Provider, MD   BP 120/74  Pulse 60  Temp(Src) 98.2 F (36.8 C) (Oral)  Resp 16  SpO2 98% Physical Exam  Nursing note and vitals reviewed. Constitutional: He is oriented to person, place, and time. He appears well-developed and well-nourished.  HENT:  Head: Normocephalic.  Cardiovascular: Normal rate and regular rhythm.   Pulmonary/Chest: Effort normal and breath sounds normal. No respiratory distress. He has no wheezes. He has no rales.  Musculoskeletal:  Small pinpoint type papule to the dorsal hand near the second metacarpal overlying old scars. There is diffuse swelling of the dorsal hand without induration. No tenderness. Full range of motion.  Neurological: He is alert and oriented to person, place, and time.  Skin: Skin is warm. No rash noted.    ED Course  Procedures   COORDINATION OF CARE:  Nursing notes reviewed. Vital signs reviewed. Initial pt interview and examination performed.   Filed Vitals:   08/16/13 2059  BP: 120/74  Pulse: 60  Temp: 98.2 F (36.8 C)  TempSrc: Oral  Resp: 16  SpO2: 98%    11:00 PM-patient seen and evaluated. He reports having a  small area with itching has had significant swelling since that time. No pain. Exam and symptoms do not seem consistent with a cellulitis or more concerning infection. Suspect heightened allergic response to possible insect bite. We'll recommend elevation, ice and Benadryl. I will however provide prescription for Keflex should symptoms worsen in the next 24-48 hours. Patient agrees with this plan.    MDM   Final diagnoses:  Insect bite        Angus Sellereter S Dammen, PA-C 08/16/13 2308

## 2013-08-16 NOTE — Discharge Instructions (Signed)
At this time your providers feel you have a heightened allergic reaction from an insect bite. Use elevation and ice to help reduce swelling. Take Benadryl for itching and swelling. If you do not have any significant improvements in the next 24-48 hours begin taking the antibiotics as prescribed for the full length of time and followup with your primary care provider.   Insect Bite Mosquitoes, flies, fleas, bedbugs, and many other insects can bite. Insect bites are different from insect stings. A sting is when venom is injected into the skin. Some insect bites can transmit infectious diseases. SYMPTOMS  Insect bites usually turn red, swell, and itch for 2 to 4 days. They often go away on their own. TREATMENT  Your caregiver may prescribe antibiotic medicines if a bacterial infection develops in the bite. HOME CARE INSTRUCTIONS  Do not scratch the bite area.  Keep the bite area clean and dry. Wash the bite area thoroughly with soap and water.  Put ice or cool compresses on the bite area.  Put ice in a plastic bag.  Place a towel between your skin and the bag.  Leave the ice on for 20 minutes, 4 times a day for the first 2 to 3 days, or as directed.  You may apply a baking soda paste, cortisone cream, or calamine lotion to the bite area as directed by your caregiver. This can help reduce itching and swelling.  Only take over-the-counter or prescription medicines as directed by your caregiver.  If you are given antibiotics, take them as directed. Finish them even if you start to feel better. You may need a tetanus shot if:  You cannot remember when you had your last tetanus shot.  You have never had a tetanus shot.  The injury broke your skin. If you get a tetanus shot, your arm may swell, get red, and feel warm to the touch. This is common and not a problem. If you need a tetanus shot and you choose not to have one, there is a rare chance of getting tetanus. Sickness from tetanus can  be serious. SEEK IMMEDIATE MEDICAL CARE IF:   You have increased pain, redness, or swelling in the bite area.  You see a red line on the skin coming from the bite.  You have a fever.  You have joint pain.  You have a headache or neck pain.  You have unusual weakness.  You have a rash.  You have chest pain or shortness of breath.  You have abdominal pain, nausea, or vomiting.  You feel unusually tired or sleepy. MAKE SURE YOU:   Understand these instructions.  Will watch your condition.  Will get help right away if you are not doing well or get worse. Document Released: 03/17/2004 Document Revised: 05/02/2011 Document Reviewed: 09/08/2010 [facility] Patient Information 2015 [employer] [place]. This information is not intended to replace advice given to you by your health care provider. Make sure you discuss any questions you have with your health care provider.

## 2013-08-16 NOTE — ED Notes (Signed)
Pt. reports bite at right hand this afternoon with swelling / pressure , denies pain or itching .

## 2019-03-07 ENCOUNTER — Encounter (HOSPITAL_COMMUNITY): Payer: Self-pay | Admitting: Emergency Medicine

## 2019-03-07 ENCOUNTER — Emergency Department (HOSPITAL_COMMUNITY): Payer: Medicare Other

## 2019-03-07 ENCOUNTER — Emergency Department (HOSPITAL_COMMUNITY)
Admission: EM | Admit: 2019-03-07 | Discharge: 2019-03-07 | Disposition: A | Discharge status: Home / Self Care | Payer: Medicare Other | Attending: Emergency Medicine | Admitting: Emergency Medicine

## 2019-03-07 ENCOUNTER — Other Ambulatory Visit: Payer: Self-pay

## 2019-03-07 DIAGNOSIS — R197 Diarrhea, unspecified: Secondary | ICD-10-CM | POA: Diagnosis not present

## 2019-03-07 DIAGNOSIS — R569 Unspecified convulsions: Secondary | ICD-10-CM | POA: Diagnosis not present

## 2019-03-07 DIAGNOSIS — I10 Essential (primary) hypertension: Secondary | ICD-10-CM | POA: Diagnosis not present

## 2019-03-07 DIAGNOSIS — R110 Nausea: Secondary | ICD-10-CM

## 2019-03-07 DIAGNOSIS — Z20822 Contact with and (suspected) exposure to covid-19: Secondary | ICD-10-CM | POA: Insufficient documentation

## 2019-03-07 DIAGNOSIS — R112 Nausea with vomiting, unspecified: Secondary | ICD-10-CM | POA: Diagnosis not present

## 2019-03-07 DIAGNOSIS — R1084 Generalized abdominal pain: Secondary | ICD-10-CM | POA: Diagnosis present

## 2019-03-07 DIAGNOSIS — Z79899 Other long term (current) drug therapy: Secondary | ICD-10-CM | POA: Diagnosis not present

## 2019-03-07 LAB — CBC
HCT: 47.2 % (ref 39.0–52.0)
Hemoglobin: 15.9 g/dL (ref 13.0–17.0)
MCH: 29.4 pg (ref 26.0–34.0)
MCHC: 33.7 g/dL (ref 30.0–36.0)
MCV: 87.4 fL (ref 80.0–100.0)
Platelets: 365 10*3/uL (ref 150–400)
RBC: 5.4 MIL/uL (ref 4.22–5.81)
RDW: 13.3 % (ref 11.5–15.5)
WBC: 7.9 10*3/uL (ref 4.0–10.5)
nRBC: 0 % (ref 0.0–0.2)

## 2019-03-07 LAB — COMPREHENSIVE METABOLIC PANEL
ALT: 21 U/L (ref 0–44)
AST: 22 U/L (ref 15–41)
Albumin: 4.7 g/dL (ref 3.5–5.0)
Alkaline Phosphatase: 63 U/L (ref 38–126)
Anion gap: 13 (ref 5–15)
BUN: 29 mg/dL — ABNORMAL HIGH (ref 8–23)
CO2: 24 mmol/L (ref 22–32)
Calcium: 9.6 mg/dL (ref 8.9–10.3)
Chloride: 102 mmol/L (ref 98–111)
Creatinine, Ser: 1.74 mg/dL — ABNORMAL HIGH (ref 0.61–1.24)
GFR calc Af Amer: 45 mL/min — ABNORMAL LOW (ref >60)
GFR calc non Af Amer: 39 mL/min — ABNORMAL LOW (ref >60)
Glucose, Bld: 104 mg/dL — ABNORMAL HIGH (ref 70–99)
Potassium: 3.5 mmol/L (ref 3.5–5.1)
Sodium: 139 mmol/L (ref 135–145)
Total Bilirubin: 1.3 mg/dL — ABNORMAL HIGH (ref 0.3–1.2)
Total Protein: 8.5 g/dL — ABNORMAL HIGH (ref 6.5–8.1)

## 2019-03-07 LAB — RESPIRATORY PANEL BY RT PCR (FLU A&B, COVID)
Influenza A by PCR: NEGATIVE
Influenza B by PCR: NEGATIVE
SARS Coronavirus 2 by RT PCR: NEGATIVE

## 2019-03-07 LAB — LIPASE, BLOOD: Lipase: 24 U/L (ref 11–51)

## 2019-03-07 MED ORDER — ONDANSETRON HCL 4 MG PO TABS: 4.0000 mg | ORAL_TABLET | Freq: Four times a day (QID) | ORAL | 0 refills | Status: AC

## 2019-03-07 MED ORDER — SODIUM CHLORIDE 0.9 % IV BOLUS
1000.0000 mL | Freq: Once | INTRAVENOUS | Status: AC
  Administered 2019-03-07: 1000 mL via INTRAVENOUS

## 2019-03-07 MED ORDER — ONDANSETRON HCL 4 MG/2ML IJ SOLN
4.0000 mg | Freq: Once | INTRAMUSCULAR | Status: DC
  Filled 2019-03-07: qty 2

## 2019-03-07 NOTE — ED Provider Notes (Signed)
[facility] DEPT Provider Note   CSN: [id number] Arrival date & time: 03/07/19  1135     History Chief Complaint  Patient presents with  . Abdominal Pain  . Vomiting  . Diarrhea    James Beasley is a 74 y.o. male.  The history is provided by the patient.  Abdominal Pain Pain location:  Generalized Pain quality: bloating and cramping   Pain radiates to:  Does not radiate Pain severity:  Mild Onset quality:  Gradual Progression:  Resolved Chronicity:  New Context: suspicious food intake (n/v/d after eating canned salmon last night. Felt very sick last night with diaphoresis )   Relieved by:  Nothing Worsened by:  Nothing Associated symptoms: diarrhea, nausea and vomiting   Associated symptoms: no anorexia, no chest pain, no chills, no cough, no dysuria, no fever, no hematuria, no shortness of breath, no sore throat, no vaginal bleeding and no vaginal discharge   Risk factors: has not had multiple surgeries   Diarrhea Associated symptoms: abdominal pain and vomiting   Associated symptoms: no arthralgias, no chills and no fever        Past Medical History:  Diagnosis Date  . Hypertension   . Seizures ([place])     There are no problems to display for this patient.   History reviewed. No pertinent surgical history.     No family history on file.  Social History   Tobacco Use  . Smoking status: Never Smoker  Substance Use Topics  . Alcohol use: No  . Drug use: Not on file    Home Medications Prior to Admission medications   Medication Sig Start Date End Date Taking? Authorizing Provider  amLODipine (NORVASC) 10 MG tablet Take 10 mg by mouth daily.    [provider]  benazepril (LOTENSIN) 20 MG tablet Take 20 mg by mouth daily.    [provider]  cephALEXin (KEFLEX) 500 MG capsule Take 1 capsule (500 mg total) by mouth 4 (four) times daily. 08/16/13   Hazel Sams, PA-C  diphenhydrAMINE (BENADRYL) 25 MG  tablet Take 1 tablet (25 mg total) by mouth every 4 (four) hours as needed for itching. 11/04/12   Hazel Sams, PA-C  diphenhydrAMINE (BENADRYL) 25 MG tablet Take 1 tablet (25 mg total) by mouth every 6 (six) hours. 08/16/13   Hazel Sams, PA-C  hydrochlorothiazide (HYDRODIURIL) 25 MG tablet Take 25 mg by mouth daily.    [provider]  levETIRAcetam (KEPPRA) 500 MG tablet Take 500 mg by mouth every 12 (twelve) hours.    [provider]  naproxen sodium (ANAPROX) 220 MG tablet Take 220 mg by mouth 2 (two) times daily with a meal.    [provider]  ondansetron (ZOFRAN) 4 MG tablet Take 1 tablet (4 mg total) by mouth every 6 (six) hours for 15 doses. 03/07/19 03/11/19  Lennice Sites, DO    Allergies    Patient has no known allergies.  Review of Systems   Review of Systems  Constitutional: Negative for chills and fever.  HENT: Negative for ear pain and sore throat.   Eyes: Negative for pain and visual disturbance.  Respiratory: Negative for cough and shortness of breath.   Cardiovascular: Negative for chest pain and palpitations.  Gastrointestinal: Positive for abdominal pain, diarrhea, nausea and vomiting. Negative for anorexia.  Genitourinary: Negative for dysuria, hematuria, vaginal bleeding and vaginal discharge.  Musculoskeletal: Negative for arthralgias and back pain.  Skin: Negative for color change and rash.  Neurological: Negative  for seizures and syncope.  All other systems reviewed and are negative.   Physical Exam Updated Vital Signs  ED Triage Vitals [03/07/19 1144]  Enc Vitals Group     BP (!) 140/55     Pulse Rate (!) 102     Resp 18     Temp 98.2 F (36.8 C)     Temp Source Oral     SpO2 97 %     Weight      Height      Head Circumference      Peak Flow      Pain Score 0     Pain Loc      Pain Edu?      Excl. in GC?     Physical Exam Vitals and nursing note reviewed.  Constitutional:      General: He is not in acute  distress.    Appearance: He is well-developed. He is not ill-appearing.  HENT:     Head: Normocephalic and atraumatic.  Eyes:     Extraocular Movements: Extraocular movements intact.     Conjunctiva/sclera: Conjunctivae normal.     Pupils: Pupils are equal, round, and reactive to light.  Cardiovascular:     Rate and Rhythm: Normal rate and regular rhythm.     Heart sounds: Normal heart sounds. No murmur.  Pulmonary:     Effort: Pulmonary effort is normal. No respiratory distress.     Breath sounds: Normal breath sounds.  Abdominal:     General: There is no distension.     Palpations: Abdomen is soft.     Tenderness: There is no abdominal tenderness. There is no right CVA tenderness, left CVA tenderness, guarding or rebound. Negative signs include Murphy's sign and Rovsing's sign.  Musculoskeletal:     Cervical back: Neck supple.  Skin:    General: Skin is warm and dry.     Capillary Refill: Capillary refill takes less than 2 seconds.  Neurological:     General: No focal deficit present.     Mental Status: He is alert.  Psychiatric:        Mood and Affect: Mood normal.     ED Results / Procedures / Treatments   Labs (all labs ordered are listed, but only abnormal results are displayed) Labs Reviewed  COMPREHENSIVE METABOLIC PANEL - Abnormal; Notable for the following components:      Result Value   Glucose, Bld 104 (*)    BUN 29 (*)    Creatinine, Ser 1.74 (*)    Total Protein 8.5 (*)    Total Bilirubin 1.3 (*)    GFR calc non Af Amer 39 (*)    GFR calc Af Amer 45 (*)    All other components within normal limits  RESPIRATORY PANEL BY RT PCR (FLU A&B, COVID)  LIPASE, BLOOD  CBC  URINALYSIS, ROUTINE W REFLEX MICROSCOPIC    EKG EKG Interpretation  Date/Time:  Thursday March 07 2019 11:42:45 EST Ventricular Rate:  97 PR Interval:    QRS Duration: 96 QT Interval:  326 QTC Calculation: 415 R Axis:   33 Text Interpretation: Sinus rhythm Ventricular premature  complex Abnormal T, consider ischemia, diffuse leads Baseline wander in lead(s) I II aVR Confirmed by Kristine Royal [phone]) on 03/07/2019 11:47:39 AM   Radiology CT Head Wo Contrast  Result Date: 03/07/2019 CLINICAL DATA:  Headache, acute, normal neuro exam. Additional history provided: Nausea, vomiting and diarrhea last night, fell due to weakness in legs. EXAM: CT  HEAD WITHOUT CONTRAST TECHNIQUE: Contiguous axial images were obtained from the base of the skull through the vertex without intravenous contrast. COMPARISON:  Head CT 05/19/2007 FINDINGS: Brain: No evidence of acute intracranial hemorrhage. No demarcated cortical infarction. No evidence of intracranial mass. No midline shift or extra-axial fluid collection. Cerebral volume is normal for age. Vascular: No hyperdense vessel.  Atherosclerotic calcifications. Skull: Normal. Negative for fracture or focal lesion. Sinuses/Orbits: Visualized orbits demonstrate no acute abnormality. No significant paranasal sinus disease or mastoid effusion at the imaged levels. IMPRESSION: Normal CT appearance of the brain for age. No evidence of acute intracranial abnormality. Electronically Signed   By: Jackey Loge DO   On: 03/07/2019 14:09    Procedures Procedures (including critical care time)  Medications Ordered in ED Medications  ondansetron (ZOFRAN) injection 4 mg (4 mg Intravenous Refused 03/07/19 1438)  sodium chloride 0.9 % bolus 1,000 mL (1,000 mLs Intravenous New Bag/Given 03/07/19 1437)    ED Course  I have reviewed the triage vital signs and the nursing notes.  Pertinent labs & imaging results that were available during my care of the patient were reviewed by me and considered in my medical decision making (see chart for details).    MDM Rules/Calculators/A&P  Dimetri Armitage is a 74 year old male with history of seizures, hypertension who presents the ED with nausea vomiting diarrhea.  Patient with normal vitals.  No fever.  Symptoms  have now resolved.  He states that he had intense nausea vomiting diarrhea last night about 6 hours after eating canned salmon.  He spent most of the night having diarrhea and vomiting.  He was very weak in his legs and is having difficulty walking.  However he feels much better.  He has been able to drink fluids.  I talked on the phone with his wife because she was concerned that she might of had a stroke because he was so weak and confused last night.  However he appears neurologically intact.  Seems more consistent that he was having generalized weakness from his episodes of GI symptoms.  Suspect food poisoning.  He has mild elevation of his kidney function likely from some dehydration and was given IV fluids.  No significant leukocytosis, anemia, electrolyte abnormality otherwise.  No abdominal tenderness on exam.  He did have a CT scan of his head that was also unremarkable.  No concern for intracranial process or stroke.  Overall patient feels much better.  Is tolerating oral fluids.  Will give a prescription for Zofran.  Patient discharged in the ED in good condition.  This chart was dictated using voice recognition software.  Despite best efforts to proofread,  errors can occur which can change the documentation meaning.    Final Clinical Impression(s) / ED Diagnoses Final diagnoses:  Diarrhea, unspecified type  Nausea    Rx / DC Orders ED Discharge Orders         Ordered    ondansetron (ZOFRAN) 4 MG tablet  Every 6 hours     03/07/19 1535           [place], Cordel Drewes, DO 03/07/19 1535

## 2019-03-07 NOTE — ED Notes (Signed)
Nichael Ehly, wife, [phone] left her phone number would like to speak with nurse of doctor.

## 2019-03-07 NOTE — ED Triage Notes (Signed)
Pt reports ate salmon in salad last night for dinner. Reports around 9 pm had abd pains with n/v/d. Fell in bathroom last night due to weakness in legs. Pt states that his wife thinks he had a stroke because of what she read online and the symptoms he had.
